# Patient Record
Sex: Male | Born: 1985 | Race: White | Hispanic: No | Marital: Single | Smoking: Former smoker
Health system: Southern US, Community
[De-identification: ages and names within clinical notes are randomized; demographics above are authoritative.]

## PROBLEM LIST (undated history)

## (undated) DIAGNOSIS — S069X9A Unspecified intracranial injury with loss of consciousness of unspecified duration, initial encounter: Secondary | ICD-10-CM

## (undated) DIAGNOSIS — S069XAA Unspecified intracranial injury with loss of consciousness status unknown, initial encounter: Secondary | ICD-10-CM

---

## 2016-08-09 ENCOUNTER — Emergency Department: Payer: No Typology Code available for payment source

## 2016-08-09 ENCOUNTER — Emergency Department
Admission: EM | Admit: 2016-08-09 | Discharge: 2016-08-09 | Disposition: A | Discharge status: Home / Self Care | Payer: No Typology Code available for payment source | Attending: Emergency Medicine | Admitting: Emergency Medicine

## 2016-08-09 ENCOUNTER — Emergency Department (HOSPITAL_COMMUNITY): Payer: Self-pay

## 2016-08-09 ENCOUNTER — Encounter: Payer: Self-pay | Admitting: Emergency Medicine

## 2016-08-09 DIAGNOSIS — T148XXA Other injury of unspecified body region, initial encounter: Secondary | ICD-10-CM | POA: Insufficient documentation

## 2016-08-09 DIAGNOSIS — R102 Pelvic and perineal pain: Secondary | ICD-10-CM | POA: Diagnosis not present

## 2016-08-09 DIAGNOSIS — M546 Pain in thoracic spine: Secondary | ICD-10-CM | POA: Diagnosis not present

## 2016-08-09 DIAGNOSIS — R109 Unspecified abdominal pain: Secondary | ICD-10-CM | POA: Insufficient documentation

## 2016-08-09 DIAGNOSIS — M545 Low back pain: Secondary | ICD-10-CM | POA: Diagnosis not present

## 2016-08-09 DIAGNOSIS — Y999 Unspecified external cause status: Secondary | ICD-10-CM | POA: Diagnosis not present

## 2016-08-09 DIAGNOSIS — Y92410 Unspecified street and highway as the place of occurrence of the external cause: Secondary | ICD-10-CM | POA: Diagnosis not present

## 2016-08-09 DIAGNOSIS — Y9389 Activity, other specified: Secondary | ICD-10-CM | POA: Insufficient documentation

## 2016-08-09 DIAGNOSIS — R079 Chest pain, unspecified: Secondary | ICD-10-CM | POA: Diagnosis not present

## 2016-08-09 DIAGNOSIS — Z87891 Personal history of nicotine dependence: Secondary | ICD-10-CM | POA: Insufficient documentation

## 2016-08-09 DIAGNOSIS — R52 Pain, unspecified: Secondary | ICD-10-CM

## 2016-08-09 DIAGNOSIS — S0990XA Unspecified injury of head, initial encounter: Secondary | ICD-10-CM | POA: Insufficient documentation

## 2016-08-09 DIAGNOSIS — T07XXXA Unspecified multiple injuries, initial encounter: Secondary | ICD-10-CM

## 2016-08-09 HISTORY — DX: Unspecified intracranial injury with loss of consciousness status unknown, initial encounter: S06.9XAA

## 2016-08-09 HISTORY — DX: Unspecified intracranial injury with loss of consciousness of unspecified duration, initial encounter: S06.9X9A

## 2016-08-09 LAB — BASIC METABOLIC PANEL
Anion gap: 9 (ref 5–15)
BUN: 12 mg/dL (ref 6–20)
CO2: 25 mmol/L (ref 22–32)
Calcium: 9.7 mg/dL (ref 8.9–10.3)
Chloride: 104 mmol/L (ref 101–111)
Creatinine, Ser: 1 mg/dL (ref 0.61–1.24)
GFR calc Af Amer: >60 mL/min (ref >60)
Glucose, Bld: 100 mg/dL — ABNORMAL HIGH (ref 65–99)
POTASSIUM: 3.7 mmol/L (ref 3.5–5.1)
SODIUM: 138 mmol/L (ref 135–145)

## 2016-08-09 MED ORDER — IOPAMIDOL (ISOVUE-300) INJECTION 61%
100.0000 mL | Freq: Once | INTRAVENOUS | Status: AC | PRN
  Administered 2016-08-09: 100 mL via INTRAVENOUS
  Filled 2016-08-09: qty 100

## 2016-08-09 MED ORDER — IBUPROFEN 800 MG PO TABS: 800.0000 mg | ORAL_TABLET | Freq: Three times a day (TID) | ORAL | 0 refills | Status: AC | PRN
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
